# Patient Record
Sex: Female | Born: 1975 | Race: White | Hispanic: No | Marital: Single | Smoking: Never smoker
Health system: Southern US, Community
[De-identification: ages and names within clinical notes are randomized; demographics above are authoritative.]

## PROBLEM LIST (undated history)

## (undated) DIAGNOSIS — T7840XA Allergy, unspecified, initial encounter: Secondary | ICD-10-CM

## (undated) DIAGNOSIS — G43909 Migraine, unspecified, not intractable, without status migrainosus: Secondary | ICD-10-CM

## (undated) HISTORY — DX: Migraine, unspecified, not intractable, without status migrainosus: G43.909

## (undated) HISTORY — PX: EYE SURGERY: SHX253

## (undated) HISTORY — PX: COSMETIC SURGERY: SHX468

## (undated) HISTORY — DX: Allergy, unspecified, initial encounter: T78.40XA

---

## 2005-07-13 ENCOUNTER — Other Ambulatory Visit: Admission: RE | Admit: 2005-07-13 | Discharge: 2005-07-13 | Payer: Self-pay | Admitting: Obstetrics and Gynecology

## 2006-07-16 ENCOUNTER — Other Ambulatory Visit: Admission: RE | Admit: 2006-07-16 | Discharge: 2006-07-16 | Payer: Self-pay | Admitting: Obstetrics and Gynecology

## 2007-10-17 ENCOUNTER — Other Ambulatory Visit: Admission: RE | Admit: 2007-10-17 | Discharge: 2007-10-17 | Payer: Self-pay | Admitting: Obstetrics and Gynecology

## 2014-10-31 ENCOUNTER — Ambulatory Visit (INDEPENDENT_AMBULATORY_CARE_PROVIDER_SITE_OTHER): Payer: 59 | Admitting: Emergency Medicine

## 2014-10-31 ENCOUNTER — Ambulatory Visit (INDEPENDENT_AMBULATORY_CARE_PROVIDER_SITE_OTHER): Payer: 59

## 2014-10-31 VITALS — BP 112/72 | HR 78 | Temp 97.8°F | Resp 16 | Ht 66.0 in | Wt 141.2 lb

## 2014-10-31 DIAGNOSIS — R05 Cough: Secondary | ICD-10-CM

## 2014-10-31 DIAGNOSIS — R059 Cough, unspecified: Secondary | ICD-10-CM

## 2014-10-31 MED ORDER — PREDNISONE 20 MG PO TABS: 40.0000 mg | ORAL_TABLET | Freq: Every day | ORAL | Status: AC

## 2014-10-31 MED ORDER — HYDROCOD POLST-CHLORPHEN POLST 10-8 MG/5ML PO LQCR: 5.0000 mL | Freq: Two times a day (BID) | ORAL | Status: AC | PRN

## 2014-10-31 MED ORDER — ALBUTEROL SULFATE HFA 108 (90 BASE) MCG/ACT IN AERS: 2.0000 | INHALATION_SPRAY | RESPIRATORY_TRACT | Status: AC | PRN

## 2014-10-31 NOTE — Progress Notes (Signed)
Subjective:    Patient ID: Diana Williamson, female    DOB: 03/26/1976, 38 y.o.   MRN: [id number]  PCP: No primary care provider on file.  Chief Complaint  Patient presents with  . Chest Pain    states her lungs hurt; recurrent sickness  . Cough  . chest congestion   There are no active problems to display for this patient.  Prior to Admission medications   Medication Sig Start Date End Date Taking? Authorizing Provider  aspirin-acetaminophen-caffeine (EXCEDRIN MIGRAINE) [phone] MG per tablet Take by mouth every 6 (six) hours as needed for headache.   Yes Historical Provider, MD  levonorgestrel (MIRENA) 20 MCG/24HR IUD 1 each by Intrauterine route once.   Yes Historical Provider, MD  topiramate (TOPAMAX) 50 MG tablet Take 50 mg by mouth 2 (two) times daily.   Yes Historical Provider, MD   Medications, allergies, past medical history, surgical history, family history, social history and problem list reviewed and updated.  HPI  [age] yof with no pertinent pmh presents today with ongoing cough.  She was sick 2 months ago with uri sx that lasted approx one week. Most sx resolved but cough persisted. Was initially prod for first few wks but has not been prod since. She went to diff urgent care 3 wks ago and was given azithro and tessalon. This didn't help resolve the cough at all.   She presents today now with 2 months of mostly non-prod cough. She feels the cough is due to congestion in lungs and not from a tickle in throat. No other uri sx. Has had right sided sharp cp with coughing that resolves several mins after coughing spell resolves. No exertional cp. No abd pain, N/V, diarrhea. No itchy/watery eyes. No hx allergies. Denies any wheezing at home. Has occasional reflux sx after spicy meals late at night otherwise no persistent reflux.   She works in a prison.   Review of Systems No SOB. No unintentional wt loss. No night sweats.     Objective:   Physical Exam  Constitutional:  She is oriented to person, place, and time. She appears well-developed and well-nourished.  Non-toxic appearance. She does not have a sickly appearance. She does not appear ill. No distress.  BP 112/72 mmHg  Pulse 78  Temp(Src) 97.8 F (36.6 C) (Oral)  Resp 16  Ht 5\' 6"  (1.676 m)  Wt 141 lb 3.2 oz (64.048 kg)  BMI 22.80 kg/m2  SpO2 99%   HENT:  Right Ear: Tympanic membrane normal.  Left Ear: Tympanic membrane normal.  Nose: Nose normal.  Mouth/Throat: Uvula is midline and oropharynx is clear and moist. No oropharyngeal exudate, posterior oropharyngeal edema, posterior oropharyngeal erythema or tonsillar abscesses.  Cardiovascular: Normal rate, regular rhythm and normal heart sounds.  Exam reveals no gallop.   No murmur heard. Pulmonary/Chest: Effort normal and breath sounds normal. She has no decreased breath sounds. She has no wheezes. She has no rhonchi. She has no rales. Chest wall is not dull to percussion. She exhibits no tenderness and no bony tenderness.  Lymphadenopathy:       Head (right side): No submental, no submandibular and no tonsillar adenopathy present.       Head (left side): No submental, no submandibular and no tonsillar adenopathy present.    She has no cervical adenopathy.  Neurological: She is alert and oriented to person, place, and time.  Psychiatric: She has a normal mood and affect. Her speech is normal.   [facility] reading (  PRIMARY) by  Dr. Cleta Albertsaub. Findings: No consolidated areas. Questionable nipple shadow on left. Heart size normal. Please comment on possible nodular area left lower lobe.      Assessment & Plan:   [age] yof with no pertinent pmh presents today with ongoing cough.  Cough - Plan: DG Chest 2 View, albuterol (PROVENTIL HFA;VENTOLIN HFA) 108 (90 BASE) MCG/ACT inhaler, predniSONE (DELTASONE) 20 MG tablet, chlorpheniramine-HYDROcodone (TUSSIONEX PENNKINETIC ER) 10-8 MG/5ML LQCR --cxr with possible nodular area left lower lobe, await radiology  read --no infiltrate --doubt post viral drip from allergies as not having any allergy sx, doubt gerd as not having reflux sx --could be post viral syndrome - prednisone 40 mg 5 days, tussionex --albuterol prn in case asthma component --rtc if sx persist, possible ppi for lpr  Donnajean Lopesodd M. Moriya Mitchell, PA-C Physician Assistant-Certified [facility]  10/31/2014 1:06 PM

## 2014-10-31 NOTE — Patient Instructions (Signed)
Your chest xray looked normal today, I'll let you know if the radiologist disagrees with the reading. Your cough could be due to a post-viral syndrome. Please take the prednisone 40 mg per day for 5 days.  Please take the cough medicine every 12 hours as needed. It may make you sleepy.  We've prescribed albuterol in case there is an asthma component to your cough. Please take one puff if you feel like the cough is bad or that you are wheezing. If this doesn't seem to help we can get rid of the albuterol. If neither of these measures help please return to clinic. We may look into reflux medication at that time.

## 2014-11-01 ENCOUNTER — Other Ambulatory Visit: Payer: Self-pay

## 2014-11-01 DIAGNOSIS — R9389 Abnormal findings on diagnostic imaging of other specified body structures: Secondary | ICD-10-CM

## 2014-11-07 ENCOUNTER — Telehealth: Payer: Self-pay

## 2014-11-07 NOTE — Telephone Encounter (Signed)
Please call [facility] at [phone] with case number [phone] to give more clinical notes for precert

## 2014-11-08 NOTE — Telephone Encounter (Signed)
Prior Auth# [id number] Exp 45 calendar days.

## 2020-10-21 ENCOUNTER — Emergency Department (HOSPITAL_BASED_OUTPATIENT_CLINIC_OR_DEPARTMENT_OTHER)
Admission: EM | Admit: 2020-10-21 | Discharge: 2020-10-22 | Disposition: A | Discharge status: Home / Self Care | Payer: No Typology Code available for payment source | Attending: Emergency Medicine | Admitting: Emergency Medicine

## 2020-10-21 ENCOUNTER — Encounter (HOSPITAL_BASED_OUTPATIENT_CLINIC_OR_DEPARTMENT_OTHER): Payer: Self-pay | Admitting: *Deleted

## 2020-10-21 ENCOUNTER — Emergency Department (HOSPITAL_BASED_OUTPATIENT_CLINIC_OR_DEPARTMENT_OTHER): Payer: No Typology Code available for payment source

## 2020-10-21 ENCOUNTER — Other Ambulatory Visit: Payer: Self-pay

## 2020-10-21 DIAGNOSIS — S6992XA Unspecified injury of left wrist, hand and finger(s), initial encounter: Secondary | ICD-10-CM | POA: Diagnosis present

## 2020-10-21 DIAGNOSIS — S63617A Unspecified sprain of left little finger, initial encounter: Secondary | ICD-10-CM | POA: Diagnosis not present

## 2020-10-21 DIAGNOSIS — W3409XA Accidental discharge from other specified firearms, initial encounter: Secondary | ICD-10-CM | POA: Insufficient documentation

## 2020-10-21 DIAGNOSIS — Z7982 Long term (current) use of aspirin: Secondary | ICD-10-CM | POA: Diagnosis not present

## 2020-10-21 NOTE — ED Notes (Signed)
Phoned supervisor , okay to just do UDS
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
## 2022-03-19 ENCOUNTER — Emergency Department (HOSPITAL_BASED_OUTPATIENT_CLINIC_OR_DEPARTMENT_OTHER)
Admission: EM | Admit: 2022-03-19 | Discharge: 2022-03-19 | Disposition: A | Discharge status: Home / Self Care | Payer: No Typology Code available for payment source | Attending: Emergency Medicine | Admitting: Emergency Medicine

## 2022-03-19 ENCOUNTER — Encounter (HOSPITAL_BASED_OUTPATIENT_CLINIC_OR_DEPARTMENT_OTHER): Payer: Self-pay

## 2022-03-19 ENCOUNTER — Other Ambulatory Visit: Payer: Self-pay

## 2022-03-19 ENCOUNTER — Emergency Department (HOSPITAL_BASED_OUTPATIENT_CLINIC_OR_DEPARTMENT_OTHER): Payer: No Typology Code available for payment source

## 2022-03-19 DIAGNOSIS — M25552 Pain in left hip: Secondary | ICD-10-CM | POA: Diagnosis present

## 2022-03-19 DIAGNOSIS — M79642 Pain in left hand: Secondary | ICD-10-CM | POA: Insufficient documentation

## 2022-03-19 DIAGNOSIS — M79643 Pain in unspecified hand: Secondary | ICD-10-CM

## 2022-03-19 DIAGNOSIS — M542 Cervicalgia: Secondary | ICD-10-CM | POA: Insufficient documentation

## 2022-03-19 DIAGNOSIS — Z7982 Long term (current) use of aspirin: Secondary | ICD-10-CM | POA: Insufficient documentation

## 2022-03-19 LAB — PREGNANCY, URINE: Preg Test, Ur: NEGATIVE

## 2022-03-19 MED ORDER — METHOCARBAMOL 500 MG PO TABS
500.0000 mg | ORAL_TABLET | Freq: Two times a day (BID) | ORAL | 0 refills | Status: DC
Start: 2022-03-19 — End: 2022-03-19

## 2022-03-19 MED ORDER — IBUPROFEN 600 MG PO TABS: 600.0000 mg | ORAL_TABLET | Freq: Four times a day (QID) | ORAL | 0 refills | Status: AC | PRN

## 2022-03-19 MED ORDER — KETOROLAC TROMETHAMINE 30 MG/ML IJ SOLN
30.0000 mg | Freq: Once | INTRAMUSCULAR | Status: AC
  Administered 2022-03-19: 30 mg via INTRAMUSCULAR
  Filled 2022-03-19: qty 1

## 2022-03-19 MED ORDER — IBUPROFEN 600 MG PO TABS
600.0000 mg | ORAL_TABLET | Freq: Four times a day (QID) | ORAL | 0 refills | Status: DC | PRN
Start: 2022-03-19 — End: 2022-03-19

## 2022-03-19 MED ORDER — METHOCARBAMOL 500 MG PO TABS
500.0000 mg | ORAL_TABLET | Freq: Once | ORAL | Status: AC
  Administered 2022-03-19: 500 mg via ORAL
  Filled 2022-03-19: qty 1

## 2022-03-19 MED ORDER — METHOCARBAMOL 500 MG PO TABS: 500.0000 mg | ORAL_TABLET | Freq: Two times a day (BID) | ORAL | 0 refills | Status: AC
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
